# Patient Record
Sex: Male | Born: 1950 | Race: White | Hispanic: No | Marital: Married | Smoking: Former smoker
Health system: Southern US, Community
[De-identification: ages and names within clinical notes are randomized; demographics above are authoritative.]

## PROBLEM LIST (undated history)

## (undated) DIAGNOSIS — J449 Chronic obstructive pulmonary disease, unspecified: Secondary | ICD-10-CM

## (undated) HISTORY — PX: HERNIA REPAIR: SHX51

## (undated) HISTORY — PX: TONSILLECTOMY: SUR1361

---

## 2011-04-06 ENCOUNTER — Other Ambulatory Visit: Payer: Self-pay

## 2011-04-06 ENCOUNTER — Ambulatory Visit (INDEPENDENT_AMBULATORY_CARE_PROVIDER_SITE_OTHER): Payer: BC Managed Care – PPO

## 2011-04-06 DIAGNOSIS — J449 Chronic obstructive pulmonary disease, unspecified: Secondary | ICD-10-CM

## 2013-01-15 ENCOUNTER — Encounter (HOSPITAL_COMMUNITY): Payer: Self-pay | Admitting: Emergency Medicine

## 2013-01-15 ENCOUNTER — Emergency Department (HOSPITAL_COMMUNITY)
Admission: EM | Admit: 2013-01-15 | Discharge: 2013-01-15 | Disposition: A | Discharge status: Home / Self Care | Payer: BC Managed Care – PPO | Attending: Emergency Medicine | Admitting: Emergency Medicine

## 2013-01-15 ENCOUNTER — Emergency Department (HOSPITAL_COMMUNITY): Payer: BC Managed Care – PPO

## 2013-01-15 DIAGNOSIS — J449 Chronic obstructive pulmonary disease, unspecified: Secondary | ICD-10-CM | POA: Insufficient documentation

## 2013-01-15 DIAGNOSIS — Y939 Activity, unspecified: Secondary | ICD-10-CM | POA: Insufficient documentation

## 2013-01-15 DIAGNOSIS — Z23 Encounter for immunization: Secondary | ICD-10-CM | POA: Insufficient documentation

## 2013-01-15 DIAGNOSIS — Z79899 Other long term (current) drug therapy: Secondary | ICD-10-CM | POA: Insufficient documentation

## 2013-01-15 DIAGNOSIS — S61409A Unspecified open wound of unspecified hand, initial encounter: Secondary | ICD-10-CM | POA: Insufficient documentation

## 2013-01-15 DIAGNOSIS — F17200 Nicotine dependence, unspecified, uncomplicated: Secondary | ICD-10-CM | POA: Insufficient documentation

## 2013-01-15 DIAGNOSIS — W298XXA Contact with other powered powered hand tools and household machinery, initial encounter: Secondary | ICD-10-CM | POA: Insufficient documentation

## 2013-01-15 DIAGNOSIS — S61411A Laceration without foreign body of right hand, initial encounter: Secondary | ICD-10-CM

## 2013-01-15 DIAGNOSIS — Y929 Unspecified place or not applicable: Secondary | ICD-10-CM | POA: Insufficient documentation

## 2013-01-15 DIAGNOSIS — J4489 Other specified chronic obstructive pulmonary disease: Secondary | ICD-10-CM | POA: Insufficient documentation

## 2013-01-15 HISTORY — DX: Chronic obstructive pulmonary disease, unspecified: J44.9

## 2013-01-15 MED ORDER — TETANUS-DIPHTH-ACELL PERTUSSIS 5-2.5-18.5 LF-MCG/0.5 IM SUSP
0.5000 mL | Freq: Once | INTRAMUSCULAR | Status: AC
  Administered 2013-01-15: 0.5 mL via INTRAMUSCULAR
  Filled 2013-01-15: qty 0.5

## 2013-01-15 MED ORDER — BACITRACIN 500 UNIT/GM EX OINT
1.0000 "application " | TOPICAL_OINTMENT | Freq: Two times a day (BID) | CUTANEOUS | Status: DC
  Filled 2013-01-15 (×2): qty 0.9

## 2013-01-15 MED ORDER — CEPHALEXIN 500 MG PO CAPS: 500.0000 mg | ORAL_CAPSULE | Freq: Four times a day (QID) | ORAL | Status: DC

## 2013-01-15 MED ORDER — HYDROCODONE-ACETAMINOPHEN 5-325 MG PO TABS: 1.0000 | ORAL_TABLET | Freq: Four times a day (QID) | ORAL | Status: DC | PRN

## 2013-01-15 NOTE — ED Provider Notes (Signed)
Medical screening examination/treatment/procedure(s) were conducted as a shared visit with non-physician practitioner(s) and myself.  I personally evaluated the patient during the encounter   Patient with injury to right hand from a saw. Possible partial muscle injury, but is neurovascularly intact with full strength. Tetanus updated, will close wound and give close f/u with hand.  Audree Camel, MD 01/15/13 [phone]

## 2013-01-15 NOTE — ED Notes (Signed)
Deep 4 cm laceration to r/side of r/hand- next to 5th finger. Bleeding controlled by pressure. Pt was cut by [employer] at 1400 today

## 2013-01-15 NOTE — ED Provider Notes (Signed)
CSN: [id number]     Arrival date & time 01/15/13  1415 History   First MD Initiated Contact with Patient 01/15/13 1429     Chief Complaint  Patient presents with  . Extremity Laceration    deep 4cm laceration to side of r/hand   (Consider location/radiation/quality/duration/timing/severity/associated sxs/prior Treatment) HPI Lawrence Rice is a 62 y.o. male presents emergency department with complaint of laceration to the right hand. He should states that he cut his hand by [employer] around 2 PM today. States he has full range of motion of all his fingers and denies any numbness or weakness distal to the cut. States the laceration is very deep and he can see "meat sticking out." Patient unsure of his last tetanus. Patient denies any other injuries or complaints. He applied pressure to control the bleeding. He did not take any medications for  Past Medical History  Diagnosis Date  . COPD (chronic obstructive pulmonary disease)    Past Surgical History  Procedure Laterality Date  . Hernia repair    . Tonsillectomy     Family History  Problem Relation Age of Onset  . Diabetes Sister    History  Substance Use Topics  . Smoking status: Current Every Day Smoker    Types: Cigarettes  . Smokeless tobacco: Not on file  . Alcohol Use: Yes    Review of Systems  Skin: Positive for wound.  Neurological: Negative for weakness and numbness.  All other systems reviewed and are negative.    Allergies  Review of patient's allergies indicates no known allergies.  Home Medications   Current Outpatient Rx  Name  Route  Sig  Dispense  Refill  . ADVAIR DISKUS 250-50 MCG/DOSE AEPB   Inhalation   Inhale 1 puff into the lungs 2 (two) times daily.          . fish oil-omega-3 fatty acids 1000 MG capsule   Oral   Take 2 g by mouth daily.         . Multiple Vitamin (MULTIVITAMIN WITH MINERALS) TABS tablet   Oral   Take 1 tablet by mouth daily.         . Vitamin D, Ergocalciferol,  (DRISDOL) 50000 UNITS CAPS capsule   Oral   Take 50,000 Units by mouth every 7 (seven) days.           BP 119/62  Pulse 85  Temp(Src) 98.2 F (36.8 C) (Oral)  Resp 16  SpO2 94% Physical Exam  Nursing note and vitals reviewed. Constitutional: He appears well-developed and well-nourished. No distress.  HENT:  Head: Normocephalic and atraumatic.  Eyes: Conjunctivae are normal.  Neck: Neck supple.  Cardiovascular: Normal rate, regular rhythm and normal heart sounds.   Pulmonary/Chest: Effort normal. No respiratory distress. He has no wheezes. He has no rales.  Musculoskeletal: He exhibits no edema.  6 cm laceration to the right hand over the fifth metacarpal. Patient has full range of motion of all fingers. There is good strength against resistance of the fourth and fifth digits with flexion and extension. Normal rest exam. Normal sensation distally.  Neurological: He is alert.  Skin: Skin is warm and dry.    ED Course  Procedures (including critical care time) Labs Review Labs Reviewed - No data to display Imaging Review Dg Hand Complete Right  01/15/2013   CLINICAL DATA:  Recent traumatic injury  EXAM: RIGHT HAND - COMPLETE 3+ VIEW  COMPARISON:  None.  FINDINGS: Soft tissue injury is noted medially  consistent with the patient's given clinical history. No underlying bony abnormality is seen.  IMPRESSION: Soft tissue injury without underlying bony abnormality.   Electronically Signed   By: Alcide Clever M.D.   On: 01/15/2013 15:44    LACERATION REPAIR Performed by: Lottie Mussel Authorized by: Jaynie Crumble A Consent: Verbal consent obtained. Risks and benefits: risks, benefits and alternatives were discussed Consent given by: patient Patient identity confirmed: provided demographic data Prepped and Draped in normal sterile fashion Wound explored  Laceration Location: right hand  Laceration Length: 5cm  No Foreign Bodies seen or palpated  Anesthesia:  local infiltration  Local anesthetic: lidocaine 2% wo epinephrine  Anesthetic total: 3 ml  Irrigation method: syringe Amount of cleaning: standard  Skin closure: 2 Sub Q, Vicryl rapid 5.0, 8 superficial prolene 4.0  Technique: simple interrupted  Patient tolerance: Patient tolerated the procedure well with no immediate complications.   MDM   1. Laceration of right hand, initial encounter     Pt with deep right hand laceration. Wound explored. Apparent partial muscle injury to the right 5th extensor. No tendon injury. He has full ROM of all fingers with full strength at all joints with flexion and extension against resistance.   i discussed with Dr. Merlyn Lot, who recommended closing wound, and follow up with him in the office.   Filed Vitals:   01/15/13 1433 01/15/13 1737  BP: 119/62 119/58  Pulse: 85   Temp: 98.2 F (36.8 C)   TempSrc: Oral   Resp: 16   SpO2: 94%        Lottie Mussel, PA-C 01/15/13 1915

## 2015-02-24 IMAGING — CR DG HAND COMPLETE 3+V*R*
3 series · 3 of 3 positions shown · non-contrast
Comparison: None.

CLINICAL DATA: Recent traumatic injury

EXAM:
RIGHT HAND - COMPLETE 3+ VIEW

[x hand pa right]
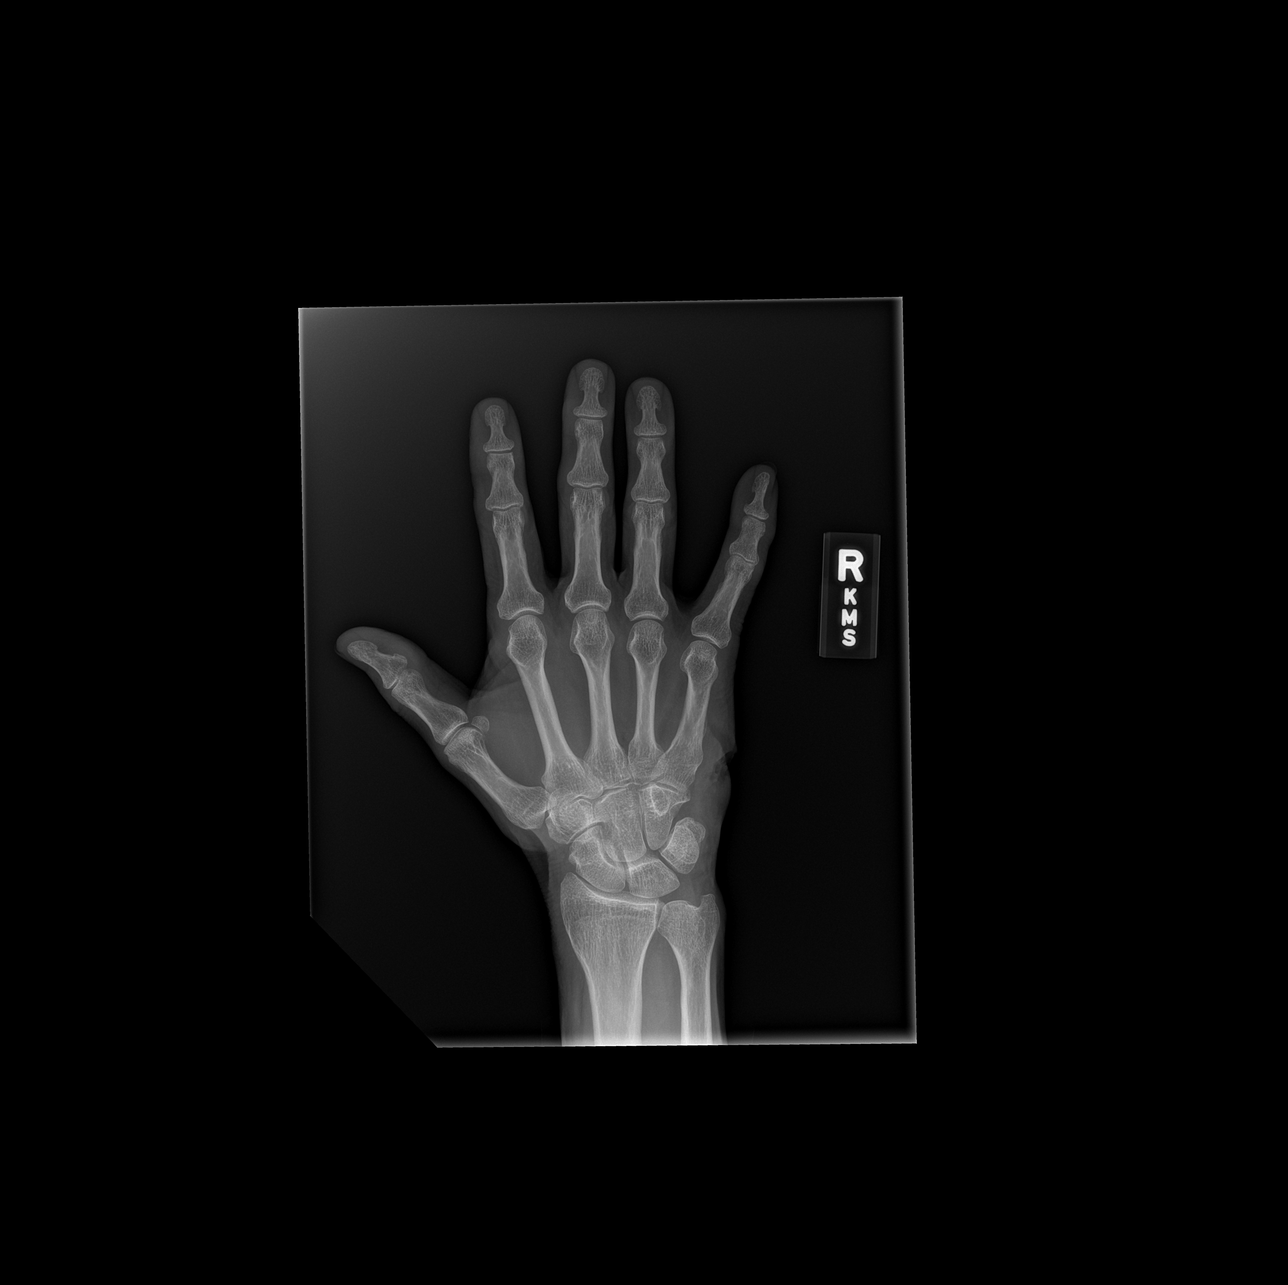

[x hand obl right]
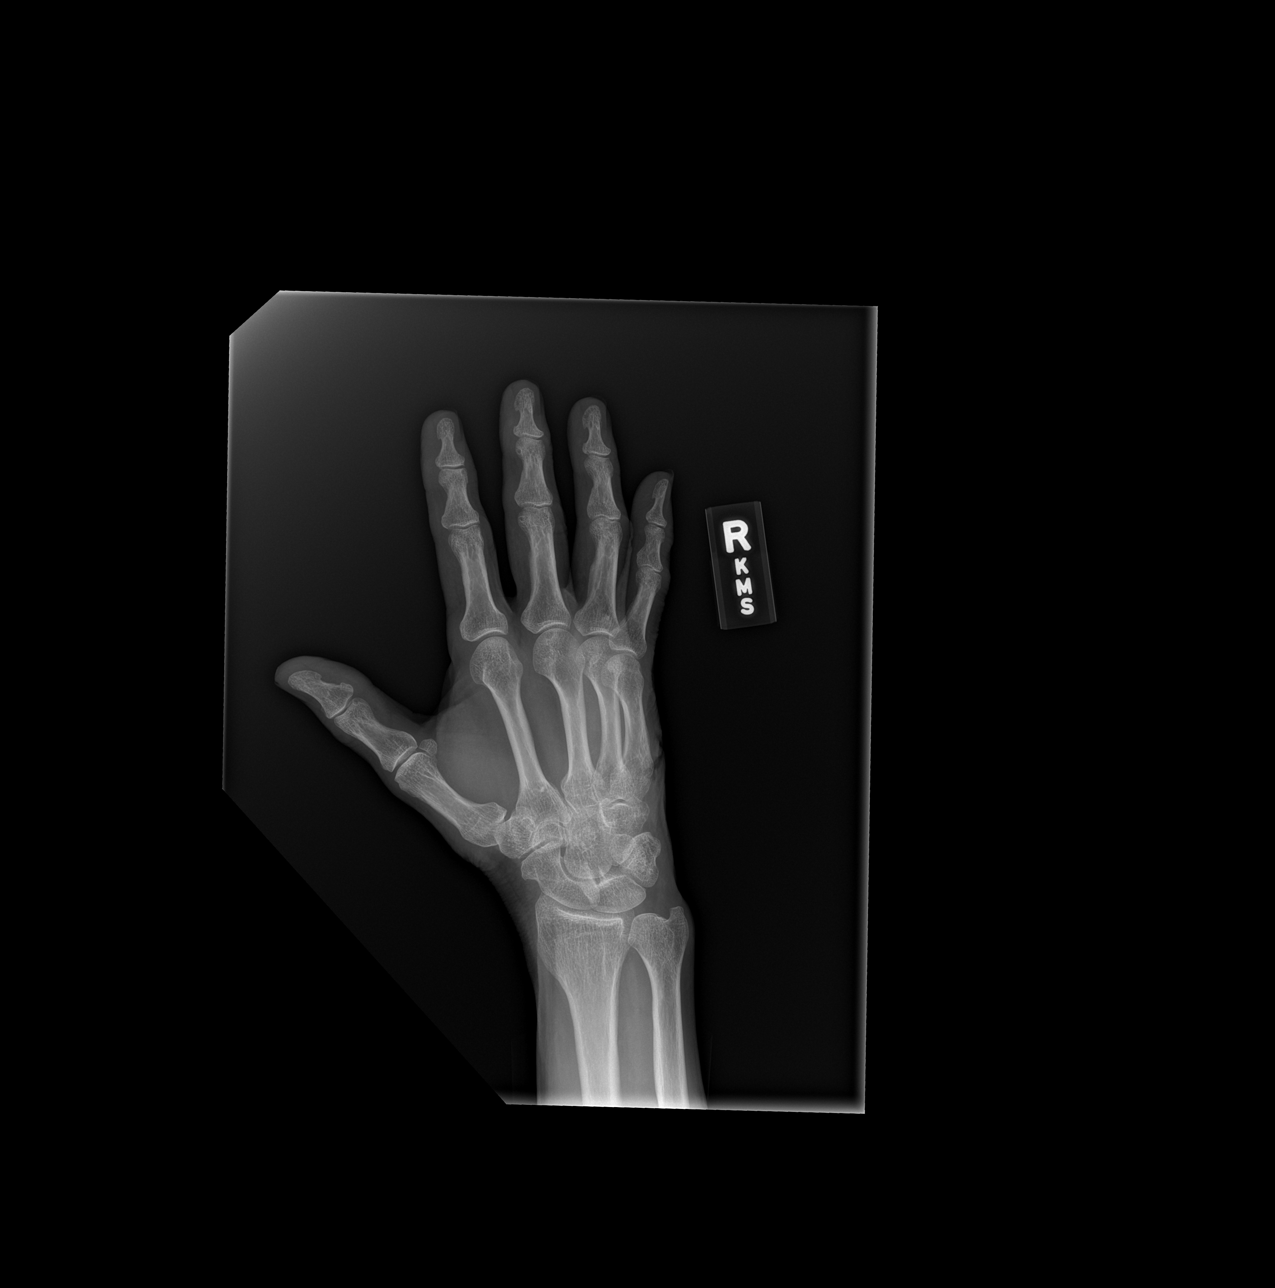

[x hand lat right]
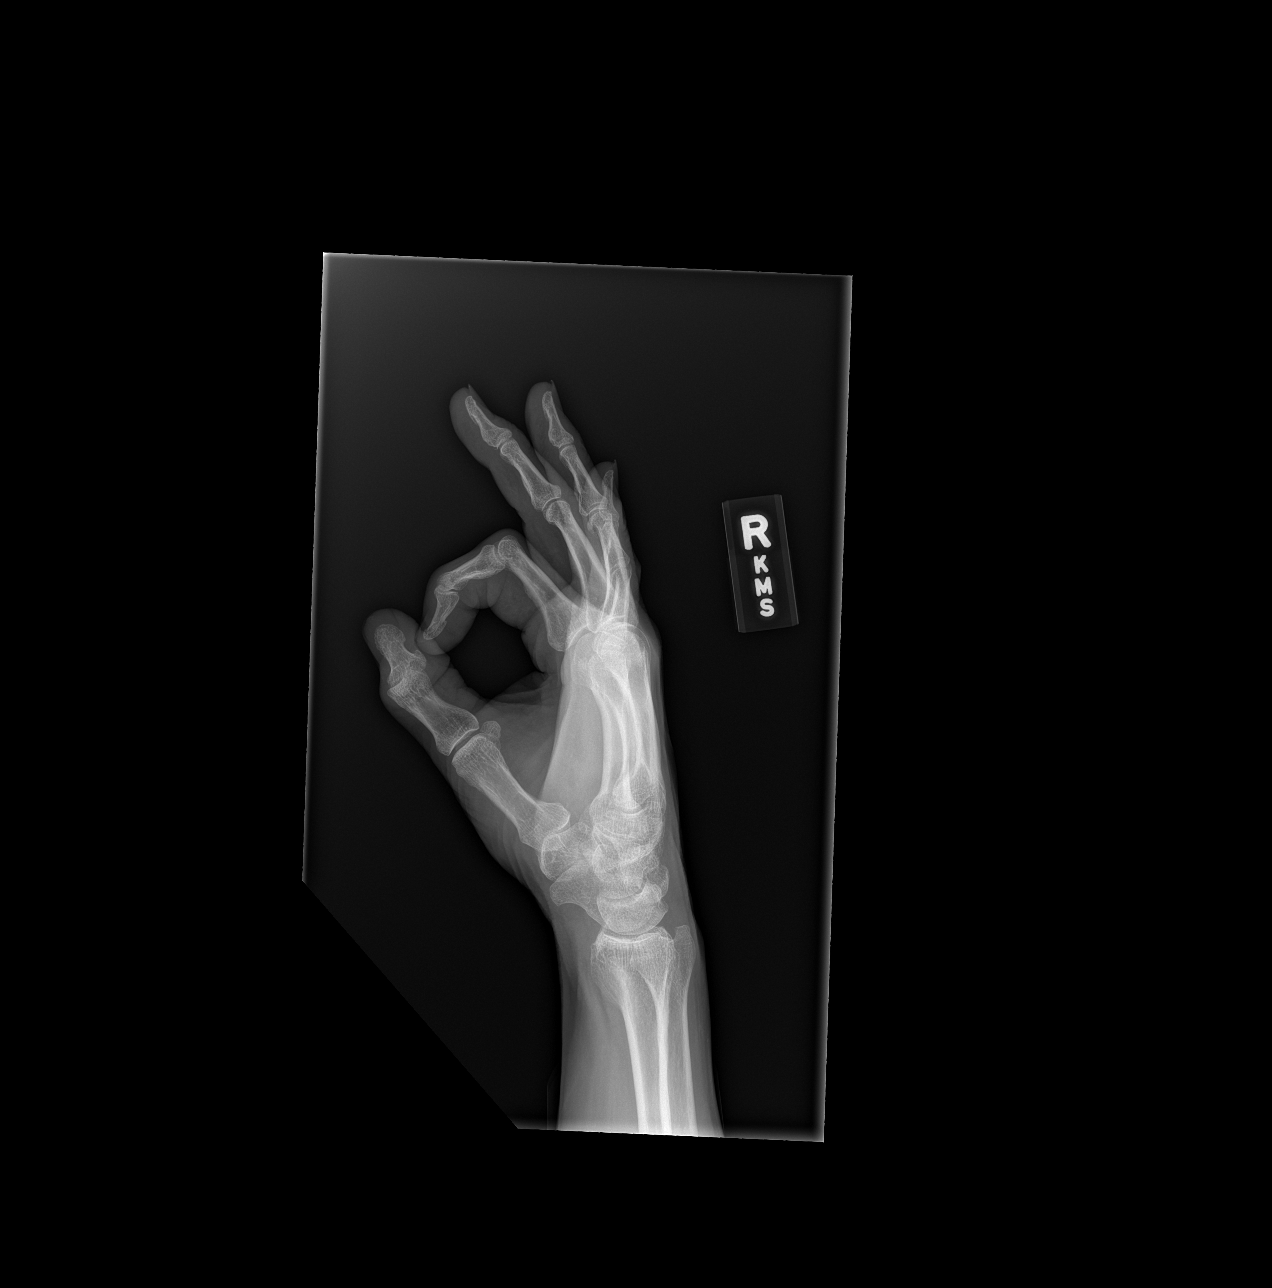

[3 of 3 positions shown; findings below may reference images not displayed]

FINDINGS: Soft tissue injury is noted medially consistent with the patient's
given clinical history. No underlying bony abnormality is seen.
IMPRESSION: Soft tissue injury without underlying bony abnormality.

## 2016-10-03 ENCOUNTER — Ambulatory Visit (HOSPITAL_COMMUNITY)
Admission: EM | Admit: 2016-10-03 | Discharge: 2016-10-03 | Disposition: A | Discharge status: Home / Self Care | Payer: BLUE CROSS/BLUE SHIELD | Attending: Family Medicine | Admitting: Family Medicine

## 2016-10-03 ENCOUNTER — Encounter (HOSPITAL_COMMUNITY): Payer: Self-pay | Admitting: Family Medicine

## 2016-10-03 DIAGNOSIS — S83512A Sprain of anterior cruciate ligament of left knee, initial encounter: Secondary | ICD-10-CM | POA: Diagnosis not present

## 2016-10-03 MED ORDER — PREDNISONE 20 MG PO TABS: ORAL_TABLET | ORAL | 0 refills | Status: DC

## 2016-10-03 NOTE — ED Triage Notes (Signed)
Pt  Reports    Pain  In  Her  r  Knee    He  Reports      He  Twisted  It  And  Has  Pain  On  Walking  And  Certain movements  And  Positions     He   Reports    Some   Swelling  As   Well

## 2016-10-03 NOTE — ED Provider Notes (Signed)
[facility]    CSN: [id number] Arrival date & time: 10/03/16  1000     History   Chief Complaint Chief Complaint  Patient presents with  . Knee Pain    HPI Arden Tinoco is a 66 y.o. male.   This is 66 year old man who suffers from COPD and presents with an injury to his left knee. He twisted the knee a couple days ago and did not think anything of it at the time. The last 48 hours he had progressive swelling and discomfort. He notes the pain is worse with weightbearing or stairs.      Past Medical History:  Diagnosis Date  . COPD (chronic obstructive pulmonary disease) (HCC)     There are no active problems to display for this patient.   Past Surgical History:  Procedure Laterality Date  . HERNIA REPAIR    . TONSILLECTOMY         Home Medications    Prior to Admission medications   Medication Sig Start Date End Date Taking? Authorizing Provider  ADVAIR DISKUS 250-50 MCG/DOSE AEPB Inhale 1 puff into the lungs 2 (two) times daily.  12/19/12   [provider]  fish oil-omega-3 fatty acids 1000 MG capsule Take 2 g by mouth daily.    [provider]  Multiple Vitamin (MULTIVITAMIN WITH MINERALS) TABS tablet Take 1 tablet by mouth daily.    [provider]  predniSONE (DELTASONE) 20 MG tablet Two daily with food 10/03/16   Elvina Sidle, MD  Vitamin D, Ergocalciferol, (DRISDOL) 50000 UNITS CAPS capsule Take 50,000 Units by mouth every 7 (seven) days.  12/14/12   [provider]    Family History Family History  Problem Relation Age of Onset  . Diabetes Sister     Social History Social History  Substance Use Topics  . Smoking status: Former Smoker    Types: Cigarettes  . Smokeless tobacco: Current User  . Alcohol use Yes     Allergies   Patient has no known allergies.   Review of Systems Review of Systems  Musculoskeletal: Positive for gait problem.  All other systems reviewed and are  negative.    Physical Exam Triage Vital Signs ED Triage Vitals  Enc Vitals Group     BP      Pulse      Resp      Temp      Temp src      SpO2      Weight      Height      Head Circumference      Peak Flow      Pain Score      Pain Loc      Pain Edu?      Excl. in GC?    No data found.   Updated Vital Signs BP 130/70 (BP Location: Right Arm)   Pulse 78   Temp 98.6 F (37 C) (Oral)   Resp 18   SpO2 100%      Physical Exam  Constitutional: He is oriented to person, place, and time. He appears well-developed and well-nourished.  HENT:  Right Ear: External ear normal.  Left Ear: External ear normal.  Eyes: Conjunctivae and EOM are normal. Pupils are equal, round, and reactive to light.  Neck: Normal range of motion. Neck supple.  Pulmonary/Chest: Effort normal.  Musculoskeletal: He exhibits no tenderness or deformity.  Left knee shows moderate effusion, particularly in the popliteal area. There is no external  tenderness on the joint line. Patient has pain with forced internal rotation of the knee. He can only flex the knee to 90 before experiencing pain  Neurological: He is alert and oriented to person, place, and time.  Skin: Skin is warm and dry.  Nursing note and vitals reviewed.    UC Treatments / Results  Labs (all labs ordered are listed, but only abnormal results are displayed) Labs Reviewed - No data to display  EKG  EKG Interpretation None       Radiology No results found.  Procedures Procedures (including critical care time)  Medications Ordered in UC Medications - No data to display   Initial Impression / Assessment and Plan / UC Course  I have reviewed the triage vital signs and the nursing notes.  Pertinent labs & imaging results that were available during my care of the patient were reviewed by me and considered in my medical decision making (see chart for details).     Final Clinical Impressions(s) / UC Diagnoses   Final  diagnoses:  Sprain of anterior cruciate ligament of left knee, initial encounter    New Prescriptions New Prescriptions   PREDNISONE (DELTASONE) 20 MG TABLET    Two daily with food  knee immobilizer for several days   Elvina SidleLauenstein, Dodie Parisi, MD 10/03/16 1043

## 2016-10-03 NOTE — Discharge Instructions (Signed)
Symptoms and signs 0.2 a kneestrain which should resolve over the next 2-5 days.

## 2020-08-28 ENCOUNTER — Encounter (HOSPITAL_COMMUNITY): Payer: Self-pay | Admitting: Emergency Medicine

## 2020-08-28 ENCOUNTER — Other Ambulatory Visit: Payer: Self-pay

## 2020-08-28 ENCOUNTER — Ambulatory Visit (HOSPITAL_COMMUNITY)
Admission: EM | Admit: 2020-08-28 | Discharge: 2020-08-28 | Disposition: A | Discharge status: Home / Self Care | Payer: Medicare HMO

## 2020-08-28 DIAGNOSIS — H539 Unspecified visual disturbance: Secondary | ICD-10-CM

## 2020-08-28 NOTE — Discharge Instructions (Addendum)
Recommend contact an Eye doctor today to schedule appointment for further evaluation. If pain develops, any worsening change in symptoms or vision or any redness develop, go to the ER ASAP. Otherwise, follow-up with an Eye doctor as planned.

## 2020-08-28 NOTE — ED Triage Notes (Signed)
Patient reports no pain, patient reports if he really focus on vision, has the perception of "fine piece of fuzz on eye"  Patient has been evaluated and purchased new prescription glasses about 6 weeks ago.  This "really hasn't affected vision"

## 2020-08-28 NOTE — ED Provider Notes (Addendum)
[facility]    CSN: [id number] Arrival date & time: 08/28/20  1115      History   Chief Complaint Chief Complaint  Patient presents with  . Eye Problem    HPI Lawrence Rice is a 70 y.o. male.   70 year old male presents with concern over seeing an object in his visual field. Started about 1 week ago and noticed when he concentrates on focusing on an object especially in the right outer lateral field, he sees something that appears like "fine fuzz". Not obscuring vision. Denies any pain, redness or irritation. Thought he may have got something in his eye but has not felt pain, irritation and denies any watery eyes or discharge. He does were glasses. He does not wear contacts. He recently went to [facility] for eye exam (without dilation) and no distinct visual issues present at the time. He denies any fever, nasal congestion. He has used OTC eye drops to try to "flush out any object" in eye with no relief. Object is getting slightly smaller when he does see it. Only chronic health issue is COPD and currently on Trelegy Ellipta daily.   The history is provided by the patient.    Past Medical History:  Diagnosis Date  . COPD (chronic obstructive pulmonary disease) (HCC)     There are no problems to display for this patient.   Past Surgical History:  Procedure Laterality Date  . HERNIA REPAIR    . TONSILLECTOMY         Home Medications    Prior to Admission medications   Medication Sig Start Date End Date Taking? Authorizing Provider  fish oil-omega-3 fatty acids 1000 MG capsule Take 2 g by mouth daily.   Yes [provider]  Fluticasone-Umeclidin-Vilant (TRELEGY ELLIPTA IN) Inhale into the lungs.   Yes [provider]  Multiple Vitamin (MULTIVITAMIN WITH MINERALS) TABS tablet Take 1 tablet by mouth daily.   Yes [provider]  Vitamin D, Ergocalciferol, (DRISDOL) 50000 UNITS CAPS capsule Take 50,000 Units by mouth every 7 (seven)  days.  12/14/12  Yes [provider]  ADVAIR DISKUS 250-50 MCG/DOSE AEPB Inhale 1 puff into the lungs 2 (two) times daily.  Patient not taking: Reported on 08/28/2020 12/19/12 08/28/20  [provider]    Family History Family History  Problem Relation Age of Onset  . Diabetes Sister     Social History Social History   Tobacco Use  . Smoking status: Former Smoker    Types: Cigarettes  . Smokeless tobacco: Current User  Vaping Use  . Vaping Use: Every day  Substance Use Topics  . Alcohol use: Yes  . Drug use: Never     Allergies   Patient has no known allergies.   Review of Systems Review of Systems  Constitutional: Negative for activity change, appetite change, chills, fatigue and fever.  HENT: Negative for congestion, sore throat and trouble swallowing.   Eyes: Positive for visual disturbance. Negative for photophobia, pain, discharge, redness and itching.  Gastrointestinal: Negative for nausea and vomiting.  Musculoskeletal: Negative for arthralgias, myalgias, neck pain and neck stiffness.  Skin: Negative for color change, rash and wound.  Allergic/Immunologic: Negative for environmental allergies, food allergies and immunocompromised state.  Neurological: Negative for dizziness, tremors, seizures, syncope, facial asymmetry, speech difficulty, weakness, light-headedness, numbness and headaches.  Hematological: Negative for adenopathy. Does not bruise/bleed easily.     Physical Exam Triage Vital Signs ED Triage Vitals  Enc Vitals Group  BP 08/28/20 1258 (!) 148/85     Pulse Rate 08/28/20 1258 74     Resp 08/28/20 1258 18     Temp 08/28/20 1258 98.1 F (36.7 C)     Temp Source 08/28/20 1258 Oral     SpO2 08/28/20 1258 94 %     Weight --      Height --      Head Circumference --      Peak Flow --      Pain Score 08/28/20 1255 0     Pain Loc --      Pain Edu? --      Excl. in GC? --    No data found.  Updated Vital Signs BP (!) 148/85  (BP Location: Right Arm)   Pulse 74   Temp 98.1 F (36.7 C) (Oral)   Resp 18   SpO2 94%   Visual Acuity Right Eye Distance:   Left Eye Distance:   Bilateral Distance:    Right Eye Near:   Left Eye Near:    Bilateral Near:     Physical Exam Vitals and nursing note reviewed.  Constitutional:      General: He is awake. He is not in acute distress.    Appearance: Normal appearance. He is well-developed and well-groomed. He is not ill-appearing.     Comments: He is sitting comfortably on the exam table in no acute distress.   HENT:     Head: Normocephalic and atraumatic.     Right Ear: Tympanic membrane, ear canal and external ear normal.     Left Ear: Tympanic membrane, ear canal and external ear normal.     Nose: Nose normal.     Mouth/Throat:     Lips: Pink.     Mouth: Mucous membranes are moist.     Pharynx: Oropharynx is clear. Uvula midline. No pharyngeal swelling, oropharyngeal exudate, posterior oropharyngeal erythema or uvula swelling.  Eyes:     General: Lids are normal. Lids are everted, no foreign bodies appreciated. Vision grossly intact. Gaze aligned appropriately. No allergic shiner, visual field deficit or scleral icterus.       Right eye: No foreign body, discharge or hordeolum.        Left eye: No foreign body, discharge or hordeolum.     Extraocular Movements: Extraocular movements intact.     Conjunctiva/sclera: Conjunctivae normal.     Right eye: Right conjunctiva is not injected. No chemosis, exudate or hemorrhage.    Left eye: Left conjunctiva is not injected. No chemosis, exudate or hemorrhage.    Pupils: Pupils are equal, round, and reactive to light.     Visual Fields: Right eye visual fields normal and left eye visual fields normal.  Cardiovascular:     Rate and Rhythm: Normal rate.  Pulmonary:     Effort: Pulmonary effort is normal.  Musculoskeletal:     Cervical back: Normal range of motion and neck supple.  Lymphadenopathy:     Cervical: No  cervical adenopathy.  Skin:    General: Skin is warm and dry.     Capillary Refill: Capillary refill takes less than 2 seconds.     Findings: No rash.  Neurological:     General: No focal deficit present.     Mental Status: He is alert and oriented to person, place, and time.     Cranial Nerves: Cranial nerves are intact.     Sensory: Sensation is intact. No sensory deficit.  Psychiatric:  Mood and Affect: Mood normal.        Behavior: Behavior normal. Behavior is cooperative.        Thought Content: Thought content normal.        Judgment: Judgment normal.      UC Treatments / Results  Labs (all labs ordered are listed, but only abnormal results are displayed) Labs Reviewed - No data to display  EKG   Radiology No results found.  Procedures Procedures (including critical care time)  Medications Ordered in UC Medications - No data to display  Initial Impression / Assessment and Plan / UC Course  I have reviewed the triage vital signs and the nursing notes.  Pertinent labs & imaging results that were available during my care of the patient were reviewed by me and considered in my medical decision making (see chart for details).     Reviewed with patient that he needs further evaluation and testing by an Ophthalmologist. No emergent issue but possible retinal issue. Discussed with patient no foreign bodies or corneal abrasion- would have pain, redness and irritation. Able to read up close and distant with glasses. No distinct decrease in vision. Recommend contact an Eye doctor (Ophthalmologist) today - 2 possible recommendations provided-  schedule appointment for further evaluation. If any redness, pain, decrease in vision occurs or symptoms worsen, go to the ER ASAP. Otherwise, follow-up with the Eye doctor as planned.  Final Clinical Impressions(s) / UC Diagnoses   Final diagnoses:  Visual disturbance     Discharge Instructions     Recommend contact an Eye  doctor today to schedule appointment for further evaluation. If pain develops, any worsening change in symptoms or vision or any redness develop, go to the ER ASAP. Otherwise, follow-up with an Eye doctor as planned.     ED Prescriptions    None     PDMP not reviewed this encounter.   Sudie Grumbling, NP 08/28/20 2225  08/28/20 2227- Updated medication list- duplicate inhaler deleted.     Sudie Grumbling, NP 08/28/20 2227

## 2022-12-13 ENCOUNTER — Ambulatory Visit
Admission: EM | Admit: 2022-12-13 | Discharge: 2022-12-13 | Disposition: A | Discharge status: Home / Self Care | Payer: Medicare HMO | Source: Home / Self Care

## 2022-12-13 DIAGNOSIS — U071 COVID-19: Secondary | ICD-10-CM | POA: Diagnosis not present

## 2022-12-13 MED ORDER — ACETAMINOPHEN 325 MG PO TABS
650.0000 mg | ORAL_TABLET | Freq: Once | ORAL | Status: AC
  Administered 2022-12-13: 650 mg via ORAL

## 2022-12-13 MED ORDER — MOLNUPIRAVIR EUA 200MG CAPSULE: 4.0000 | ORAL_CAPSULE | Freq: Two times a day (BID) | ORAL | 0 refills | Status: AC

## 2022-12-13 NOTE — ED Provider Notes (Signed)
[facility]    CSN: [id number] Arrival date & time: 12/13/22  1056      History   Chief Complaint Chief Complaint  Patient presents with   Fever    + COVID19    HPI Lawrence Rice is a 72 y.o. male.   Patient here today for evaluation of COVID.  He reports that he has had symptoms for 2 days and tested positive for COVID today.  He has had cough, congestion, fever as well as generalized weakness and nausea.  He has not had any vomiting or diarrhea.  He has taken over-the-counter medication with mild relief.  He is interested in antiviral therapy.  The history is provided by the patient.  Fever Associated symptoms: chills, congestion, cough, nausea and sore throat (resolved)   Associated symptoms: no ear pain and no vomiting     Past Medical History:  Diagnosis Date   COPD (chronic obstructive pulmonary disease) (HCC)     There are no problems to display for this patient.   Past Surgical History:  Procedure Laterality Date   HERNIA REPAIR     TONSILLECTOMY         Home Medications    Prior to Admission medications   Medication Sig Start Date End Date Taking? Authorizing Provider  Fluticasone-Umeclidin-Vilant (TRELEGY ELLIPTA IN) Inhale into the lungs.   Yes [provider]  molnupiravir EUA (LAGEVRIO) 200 mg CAPS capsule Take 4 capsules (800 mg total) by mouth 2 (two) times daily for 5 days. 12/13/22 12/18/22 Yes Tomi Bamberger, PA-C  fish oil-omega-3 fatty acids 1000 MG capsule Take 2 g by mouth daily.    [provider]  Multiple Vitamin (MULTIVITAMIN WITH MINERALS) TABS tablet Take 1 tablet by mouth daily.    [provider]  Vitamin D, Ergocalciferol, (DRISDOL) 50000 UNITS CAPS capsule Take 50,000 Units by mouth every 7 (seven) days.  12/14/12   [provider]  ADVAIR DISKUS 250-50 MCG/DOSE AEPB Inhale 1 puff into the lungs 2 (two) times daily.  Patient not taking: Reported on 08/28/2020 12/19/12 08/28/20  [provider]    Family History Family History  Problem Relation Age of Onset   Diabetes Sister     Social History Social History   Tobacco Use   Smoking status: Former    Types: Cigarettes   Smokeless tobacco: Never  Vaping Use   Vaping status: Some Days   Substances: Nicotine  Substance Use Topics   Alcohol use: Not Currently   Drug use: Never     Allergies   Patient has no known allergies.   Review of Systems Review of Systems  Constitutional:  Positive for chills and fever.  HENT:  Positive for congestion and sore throat (resolved). Negative for ear pain.   Eyes:  Negative for discharge and redness.  Respiratory:  Positive for cough. Negative for shortness of breath.   Gastrointestinal:  Positive for nausea. Negative for abdominal pain and vomiting.     Physical Exam Triage Vital Signs ED Triage Vitals  Encounter Vitals Group     BP 12/13/22 1157 134/86     Systolic BP Percentile --      Diastolic BP Percentile --      Pulse Rate 12/13/22 1157 (!) 108     Resp 12/13/22 1157 18     Temp 12/13/22 1157 (!) 101.8 F (38.8 C)     Temp Source 12/13/22 1157 Oral     SpO2 12/13/22 1157 96 %  Weight 12/13/22 1155 185 lb (83.9 kg)     Height 12/13/22 1155 5\' 11"  (1.803 m)     Head Circumference --      Peak Flow --      Pain Score 12/13/22 1151 0     Pain Loc --      Pain Education --      Exclude from Growth Chart --    No data found.  Updated Vital Signs BP 134/86 (BP Location: Left Arm)   Pulse (!) 108   Temp (!) 101.8 F (38.8 C) (Oral)   Resp 18   Ht 5\' 11"  (1.803 m)   Wt 185 lb (83.9 kg)   SpO2 96%   BMI 25.80 kg/m      Physical Exam Vitals and nursing note reviewed.  Constitutional:      General: He is not in acute distress.    Appearance: He is well-developed. He is not ill-appearing.  HENT:     Head: Normocephalic and atraumatic.     Nose: Congestion present.     Mouth/Throat:     Mouth: Mucous membranes are moist.      Pharynx: No oropharyngeal exudate or posterior oropharyngeal erythema.     Tonsils: 0 on the right. 0 on the left.  Eyes:     Conjunctiva/sclera: Conjunctivae normal.  Cardiovascular:     Rate and Rhythm: Normal rate and regular rhythm.     Heart sounds: Normal heart sounds. No murmur heard. Pulmonary:     Effort: Pulmonary effort is normal. No respiratory distress.     Breath sounds: Normal breath sounds. No wheezing, rhonchi or rales.  Skin:    General: Skin is warm and dry.  Neurological:     Mental Status: He is alert.  Psychiatric:        Mood and Affect: Mood normal.        Behavior: Behavior normal.      UC Treatments / Results  Labs (all labs ordered are listed, but only abnormal results are displayed) Labs Reviewed - No data to display  EKG   Radiology No results found.  Procedures Procedures (including critical care time)  Medications Ordered in UC Medications  acetaminophen (TYLENOL) tablet 650 mg (650 mg Oral Given 12/13/22 1200)    Initial Impression / Assessment and Plan / UC Course  I have reviewed the triage vital signs and the nursing notes.  Pertinent labs & imaging results that were available during my care of the patient were reviewed by me and considered in my medical decision making (see chart for details).    Molnupiravir prescribed given lack of recent kidney function screening to review.  Patient preferred medication sooner rather than later and declined blood work and waiting for [employer].  Recommended symptomatic treatment otherwise, increase fluids and rest with follow-up if no gradual improvement or with any worsening symptoms.  Final Clinical Impressions(s) / UC Diagnoses   Final diagnoses:  COVID-19   Discharge Instructions   None    ED Prescriptions     Medication Sig Dispense Auth. Provider   molnupiravir EUA (LAGEVRIO) 200 mg CAPS capsule Take 4 capsules (800 mg total) by mouth 2 (two) times daily for 5 days. 40 capsule  Tomi Bamberger, PA-C      PDMP not reviewed this encounter.   Tomi Bamberger, PA-C 12/13/22 1321

## 2022-12-13 NOTE — ED Triage Notes (Signed)
"  This is my 2nd day of symptoms for COVID19 with a + home test today". Symptoms include "up to 101.6 Fever, weak, nausea at times, feel bad".
